# Patient Record
Sex: Male | Born: 2000 | Race: Black or African American | Hispanic: No | Marital: Single | Smoking: Never smoker
Health system: Southern US, Community
[De-identification: ages and names within clinical notes are randomized; demographics above are authoritative.]

## PROBLEM LIST (undated history)

## (undated) DIAGNOSIS — D649 Anemia, unspecified: Secondary | ICD-10-CM

---
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
## 2019-07-31 ENCOUNTER — Encounter: Payer: Self-pay | Admitting: Family Medicine

## 2019-08-01 ENCOUNTER — Encounter: Payer: Self-pay | Admitting: Family Medicine

## 2019-11-01 IMAGING — DX DG CHEST 2V
2 series · 2 of 2 positions shown · non-contrast
Comparison: None.

CLINICAL DATA: Cough, body aches, and diarrhea for 6 days.
Generalized weakness since yesterday.

EXAM:
CHEST - 2 VIEW

[chest pa]
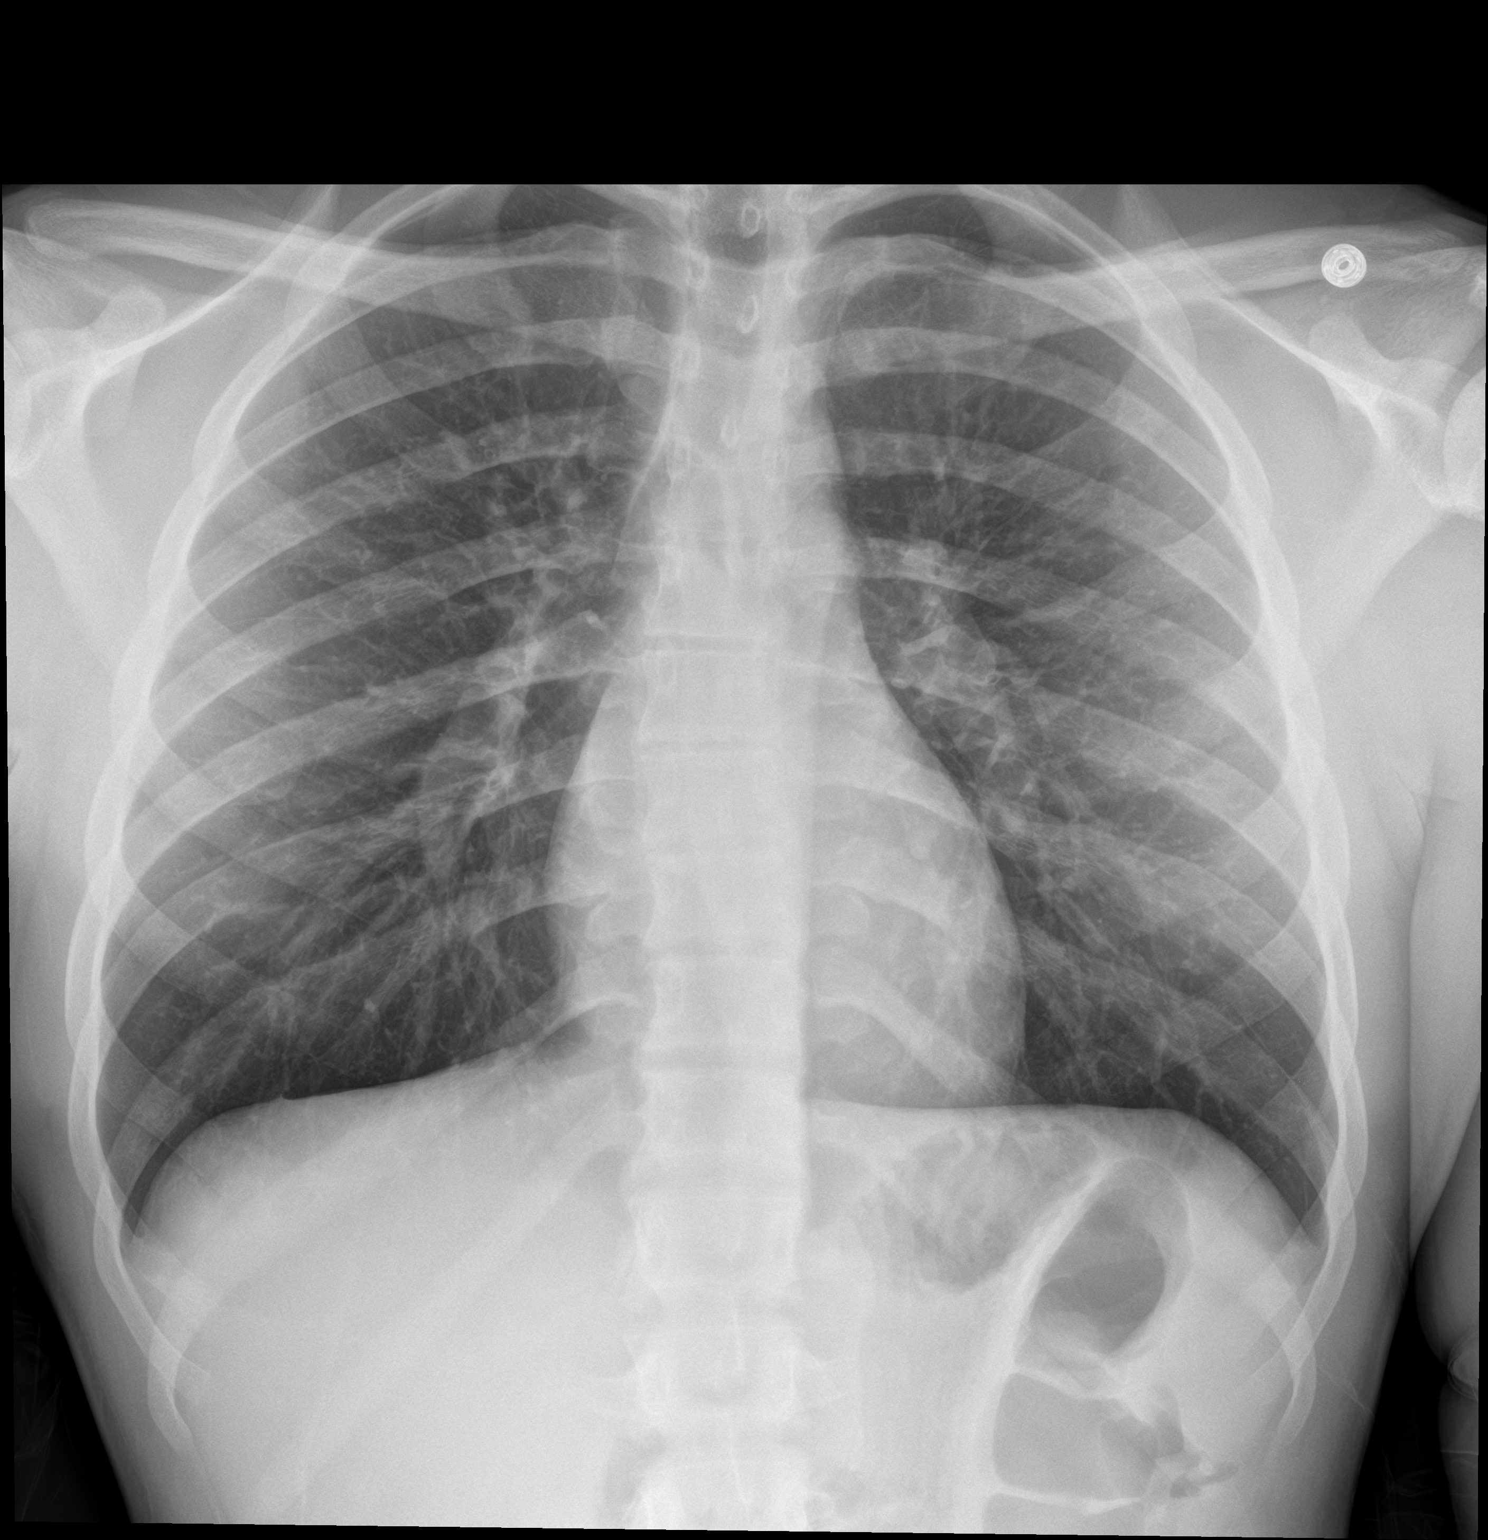

[chest lat]
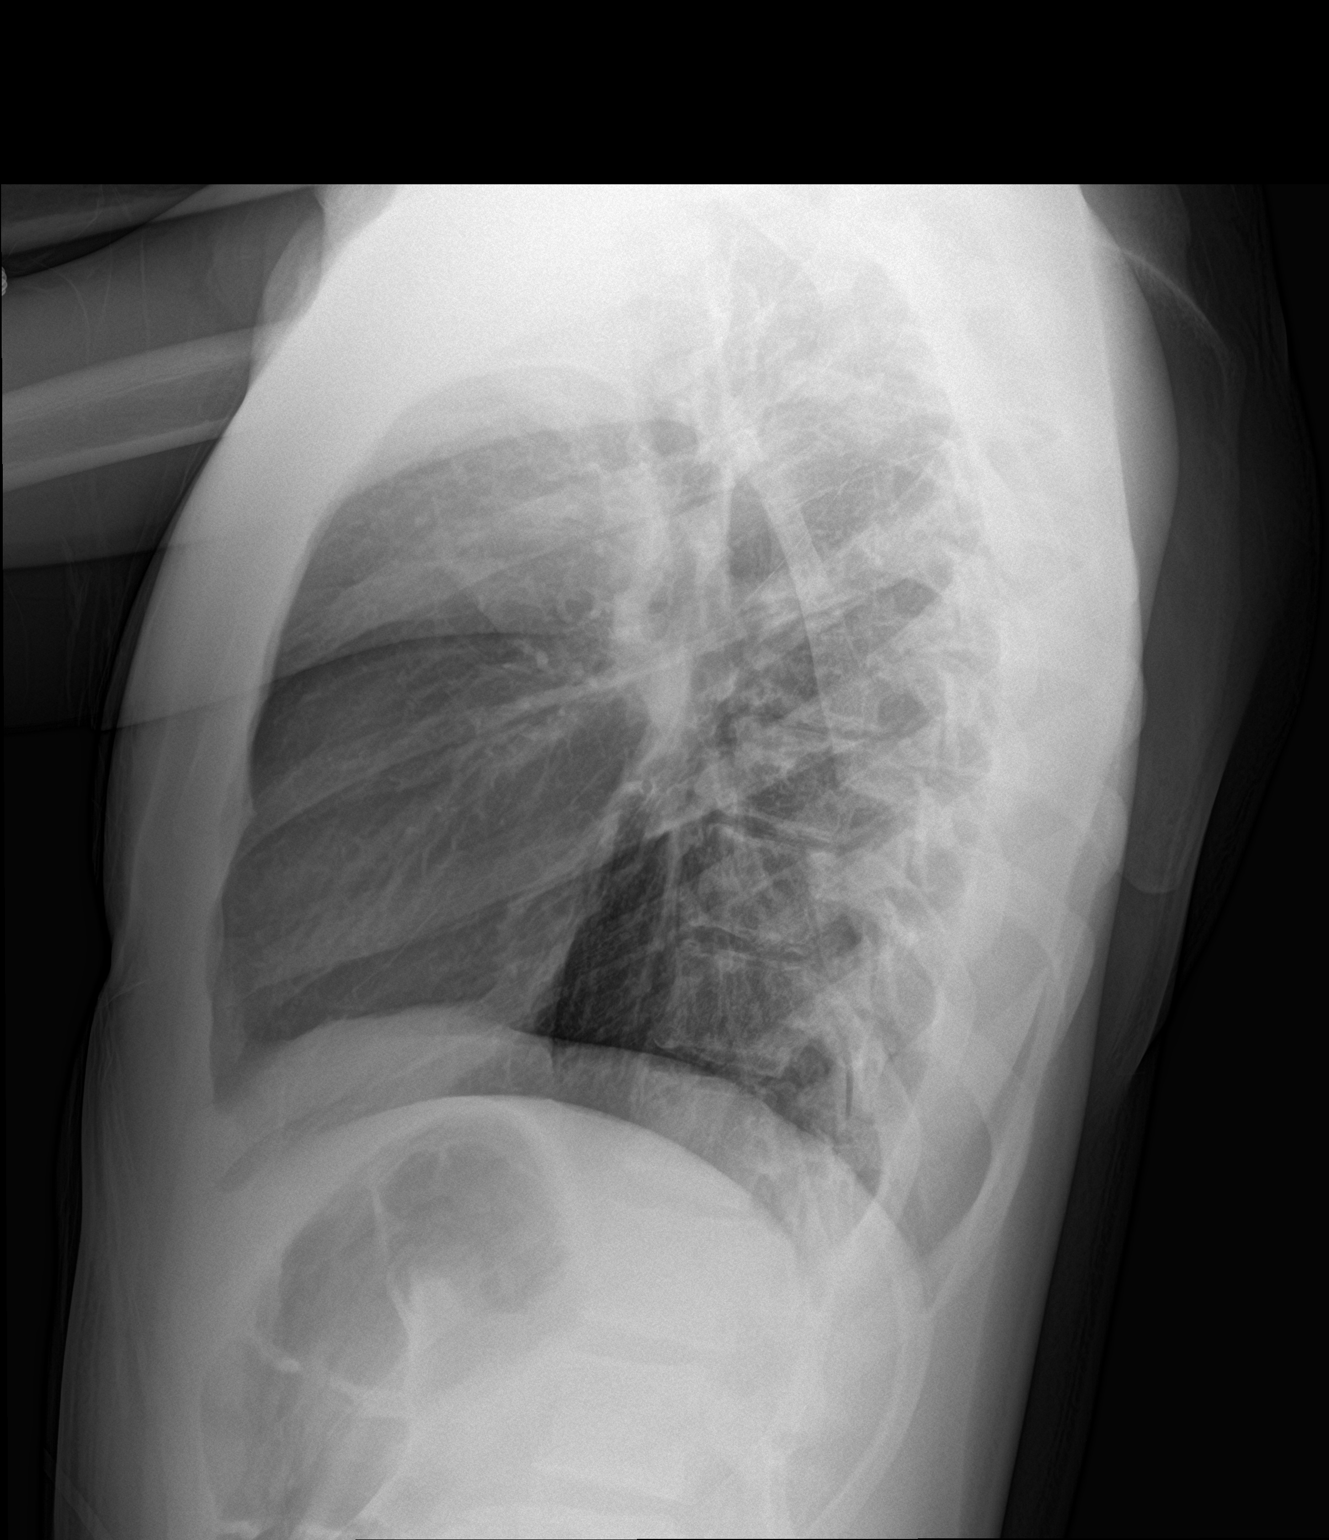

[2 of 2 positions shown; findings below may reference images not displayed]

FINDINGS: The heart size and mediastinal contours are within normal limits.
Both lungs are clear. The visualized skeletal structures are
unremarkable.
IMPRESSION: No active cardiopulmonary disease.

## 2021-07-14 ENCOUNTER — Ambulatory Visit
Admission: EM | Admit: 2021-07-14 | Discharge: 2021-07-14 | Disposition: A | Discharge status: Home / Self Care | Payer: Self-pay | Attending: Urgent Care | Admitting: Urgent Care

## 2021-07-14 ENCOUNTER — Other Ambulatory Visit: Payer: Self-pay

## 2021-07-14 DIAGNOSIS — M7918 Myalgia, other site: Secondary | ICD-10-CM

## 2021-07-14 DIAGNOSIS — R079 Chest pain, unspecified: Secondary | ICD-10-CM

## 2021-07-14 DIAGNOSIS — M546 Pain in thoracic spine: Secondary | ICD-10-CM

## 2021-07-14 DIAGNOSIS — M25562 Pain in left knee: Secondary | ICD-10-CM

## 2021-07-14 MED ORDER — TIZANIDINE HCL 4 MG PO TABS: 4.0000 mg | ORAL_TABLET | Freq: Every day | ORAL | 0 refills | Status: AC

## 2021-07-14 MED ORDER — NAPROXEN 500 MG PO TABS: 500.0000 mg | ORAL_TABLET | Freq: Two times a day (BID) | ORAL | 0 refills | Status: AC

## 2021-07-14 NOTE — ED Provider Notes (Signed)
[facility]   MRN: [id number] DOB: 2001/01/26  Subjective:   Glenn Saunders is a 21 y.o. male presenting for 1 day history of intermittent left-sided chest pains left-sided thoracic pain, shortness of breath, left knee tingling/discomfort.  Patient was very worried about his symptoms and ended up going to the hospital at [facility].  He left without being seen.  No history of pulmonary embolism, stroke, clotting disorders.  Patient works for [employer] and has to do a lot of strenuous physical labor.  Denies any particular inciting traumas.  No smoking, history of asthma.  No current facility-administered medications for this encounter.  Current Outpatient Medications:    ondansetron (ZOFRAN ODT) 4 MG disintegrating tablet, Take 1 tablet (4 mg total) by mouth every 8 (eight) hours as needed for nausea or vomiting., Disp: 20 tablet, Rfl: 0   Phenylephrine-DM-GG-APAP 5-10-200-325 MG CAPS, Take 2 capsules by mouth once as needed (for cough/congestion)., Disp: , Rfl:    No Known Allergies  Past Medical History:  Diagnosis Date   Anemia      History reviewed. No pertinent surgical history.  Family History  Problem Relation Age of Onset   Healthy Mother    Healthy Brother     Social History   Tobacco Use   Smoking status: Never   Smokeless tobacco: Never  Vaping Use   Vaping Use: Never used  Substance Use Topics   Alcohol use: No   Drug use: Not Currently    Types: Marijuana    ROS   Objective:   Vitals: BP 117/61 (BP Location: Right Arm)    Pulse (!) 56    Temp 98.2 F (36.8 C) (Oral)    Resp 20    SpO2 98%   Physical Exam Constitutional:      General: He is not in acute distress.    Appearance: Normal appearance. He is well-developed. He is not ill-appearing, toxic-appearing or diaphoretic.  HENT:     Head: Normocephalic and atraumatic.     Right Ear: External ear normal.     Left Ear: External ear normal.     Nose: Nose normal.     Mouth/Throat:      Mouth: Mucous membranes are moist.  Eyes:     General: No scleral icterus.       Right eye: No discharge.        Left eye: No discharge.     Extraocular Movements: Extraocular movements intact.  Cardiovascular:     Rate and Rhythm: Normal rate and regular rhythm.     Heart sounds: Normal heart sounds. No murmur heard.   No friction rub. No gallop.  Pulmonary:     Effort: Pulmonary effort is normal. No respiratory distress.     Breath sounds: Normal breath sounds. No stridor. No wheezing, rhonchi or rales.  Chest:     Chest wall: No tenderness.  Musculoskeletal:     Left shoulder: No swelling, deformity, effusion, laceration, tenderness, bony tenderness or crepitus. Normal range of motion. Normal strength.     Left knee: No swelling, deformity, effusion, erythema, ecchymosis, lacerations, bony tenderness or crepitus. Normal range of motion. No tenderness. Normal alignment and normal patellar mobility.  Neurological:     Mental Status: He is alert and oriented to person, place, and time.  Psychiatric:        Mood and Affect: Mood normal.        Behavior: Behavior normal.        Thought Content: Thought  content normal.    Anatomical Region Laterality Modality  Chest -- Computed Tomography   Impression   No acute findings. Narrative  CTA CHEST (THORACIC DISSECTION) [facility] CONTRAST, 07/13/2021 4:11 PM   INDICATION: Chest trauma, blunt \ R20.0 Numbness  COMPARISON: None.   TECHNIQUE: Precontrast axial images were obtained. Next, iodinated contrast was administered intravenously by rapid injection, with further multislice axial sections acquired in the arterial phase from the thoracic inlet to the upper abdomen. Coronal maximal intensity projection images were created for comprehensive analysis and diagnosis of the regional circulation.   All CT scans at [facility] and [facility] are performed using radiation dose optimization techniques as  appropriate to a performed exam, including but not limited to one or more of the following: automatic exposure control, adjustment of the mA and/or kV according to patient size, use of iterative reconstruction technique. In addition, our institution participates in a radiation dose monitoring program to optimize patient radiation exposure.   FINDINGS:  Thoracic inlet/central airways: Thyroid normal. Airway patent.  Mediastinum/hila/axilla: No adenopathy.  Heart/aorta/great vessels: Normal heart size. No pericardial effusion. No aneurysm, dissection, or significant stenosis. No central pulmonary embolism.  Lungs/pleura: No pulmonary parenchymal disease, pleural effusion, or pneumothorax.  Upper abdomen: Unremarkable.  Chest wall/MSK: Within normal limits. Procedure Note  Vira Agar., MD - 07/13/2021  Formatting of this note might be different from the original.  CTA CHEST (THORACIC DISSECTION) [facility]WO CONTRAST, 07/13/2021 4:11 PM   INDICATION: Chest trauma, blunt \ R20.0 Numbness  COMPARISON: None.   TECHNIQUE: Precontrast axial images were obtained. Next, iodinated contrast was administered intravenously by rapid injection, with further multislice axial sections acquired in the arterial phase from the thoracic inlet to the upper abdomen. Coronal maximal intensity projection images were created for comprehensive analysis and diagnosis of the regional circulation.   All CT scans at [facility] and [facility] are performed using radiation dose optimization techniques as appropriate to a performed exam, including but not limited to one or more of the following: automatic exposure control, adjustment of the mA and/or kV according to patient size, use of iterative reconstruction technique. In addition, our institution participates in a radiation dose monitoring program to optimize patient radiation exposure.   FINDINGS:  Thoracic inlet/central airways:  Thyroid normal. Airway patent.  Mediastinum/hila/axilla: No adenopathy.  Heart/aorta/great vessels: Normal heart size. No pericardial effusion. No aneurysm, dissection, or significant stenosis. No central pulmonary embolism.  Lungs/pleura: No pulmonary parenchymal disease, pleural effusion, or pneumothorax.  Upper abdomen: Unremarkable.  Chest wall/MSK: Within normal limits.   CONCLUSION:   No acute findings. Exam End: 07/13/21 16:11   Specimen Collected: 07/13/21 16:20 Last Resulted: 07/13/21 16:28  Received From: [facility]  Result Received: 07/14/21 10:06      Assessment and Plan :   PDMP not reviewed this encounter.  1. Musculoskeletal pain   2. Left-sided chest pain   3. Acute left-sided thoracic back pain   4. Acute pain of left knee    Patient had a completely normal work-up including a chest CTA, head CT and labs including a basic metabolic panel, CBC, blood sugar check.  Recommended managing his symptoms for musculoskeletal pain.  No pneumothorax was observed either on the chest CT scan.  I reassured patient.  Recommended naproxen, tizanidine. Counseled patient on potential for adverse effects with medications prescribed/recommended today, ER and return-to-clinic precautions discussed, patient verbalized understanding.  Wallis BambergMani, Junell Cullifer, PA-C 07/14/21 [id number]

## 2021-07-14 NOTE — ED Triage Notes (Signed)
Patient states yesterday he reached to pick up a box and he felt pain in his left shoulder.  Patient states that as the day went on his left hand and left foot felt like it is numb.  Patient states he had some chest discomfort yesterday and this morning  Patient states he had CT  Scan at [facility] and got no results. He ended up leaving
